# Patient Record
Sex: Female | Born: 1990 | Race: White | Hispanic: No | Marital: Single | Smoking: Current every day smoker
Health system: Southern US, Community
[De-identification: ages and names within clinical notes are randomized; demographics above are authoritative.]

---

## 2021-06-07 ENCOUNTER — Emergency Department (HOSPITAL_COMMUNITY): Payer: Self-pay

## 2021-06-07 ENCOUNTER — Encounter (HOSPITAL_COMMUNITY): Payer: Self-pay | Admitting: Emergency Medicine

## 2021-06-07 ENCOUNTER — Other Ambulatory Visit (HOSPITAL_COMMUNITY): Payer: Self-pay

## 2021-06-07 ENCOUNTER — Emergency Department (HOSPITAL_COMMUNITY)
Admission: EM | Admit: 2021-06-07 | Discharge: 2021-06-07 | Disposition: A | Discharge status: Home / Self Care | Payer: Self-pay | Attending: Emergency Medicine | Admitting: Emergency Medicine

## 2021-06-07 DIAGNOSIS — W19XXXA Unspecified fall, initial encounter: Secondary | ICD-10-CM

## 2021-06-07 DIAGNOSIS — F17210 Nicotine dependence, cigarettes, uncomplicated: Secondary | ICD-10-CM | POA: Insufficient documentation

## 2021-06-07 DIAGNOSIS — S0512XA Contusion of eyeball and orbital tissues, left eye, initial encounter: Secondary | ICD-10-CM | POA: Insufficient documentation

## 2021-06-07 DIAGNOSIS — W01198A Fall on same level from slipping, tripping and stumbling with subsequent striking against other object, initial encounter: Secondary | ICD-10-CM | POA: Insufficient documentation

## 2021-06-07 DIAGNOSIS — R519 Headache, unspecified: Secondary | ICD-10-CM | POA: Insufficient documentation

## 2021-06-07 DIAGNOSIS — S7012XA Contusion of left thigh, initial encounter: Secondary | ICD-10-CM | POA: Insufficient documentation

## 2021-06-07 LAB — PREGNANCY, URINE: Preg Test, Ur: NEGATIVE

## 2021-06-07 MED ORDER — ACETAMINOPHEN 500 MG PO TABS
1000.0000 mg | ORAL_TABLET | Freq: Once | ORAL | Status: AC
  Administered 2021-06-07: 1000 mg via ORAL
  Filled 2021-06-07: qty 2

## 2021-06-07 NOTE — ED Triage Notes (Signed)
Pt states she fell into a wall after doing tequila shots last night.  C/o bruising to L eye and L thigh.  Denies LOC.

## 2021-06-07 NOTE — ED Provider Notes (Signed)
[facility] EMERGENCY DEPARTMENT Provider Note   CSN: [id number] Arrival date & time: 06/07/21  1210     History No chief complaint on file.   Sheila Deleon is a 30 y.o. female.  HPI     30yo female presents with concern for facial and leg bruising and pain after falling last night.  She was intoxicated last night and stumbled in a hallway, hitting the left side of her face aon the wall and falling on her left side. No LOC.  Has had continuing headache since that time. Swelling around eye but otherwise no blurred vision. No epistaxis. No numbness/weakness. No neck pain or back pain. Left upper leg pain. Has been ambulatory. No n/v.  History reviewed. No pertinent past medical history.  There are no problems to display for this patient.   History reviewed. No pertinent surgical history.   OB History   No obstetric history on file.     No family history on file.  Social History   Tobacco Use   Smoking status: Every Day    Pack years: 0.00    Types: Cigarettes   Smokeless tobacco: Never  Substance Use Topics   Alcohol use: Yes   Drug use: Not Currently    Home Medications Prior to Admission medications   Medication Sig Start Date End Date Taking? Authorizing Provider  B Complex Vitamins (B COMPLEX PO) Take 1 tablet by mouth daily.   Yes [provider]  MIRENA, 52 MG, 20 MCG/DAY IUD 1 each by Intrauterine route once.   Yes [provider]  Multiple Vitamins-Minerals (ONE-A-DAY WOMENS PO) Take 1 tablet by mouth daily with breakfast.   Yes [provider]  NON FORMULARY Take 1-2 capsules by mouth See admin instructions. Immunity One capsules with Echinacea and Ginger- Take 1-2 capsules by mouth daily   Yes [provider]  TURMERIC PO Take 1-2 capsules by mouth daily.   Yes [provider]    Allergies    Patient has no known allergies.  Review of Systems   Review of Systems  Constitutional:  Negative  for fever.  HENT:  Negative for nosebleeds.   Eyes:  Negative for photophobia, pain, redness and visual disturbance.  Respiratory:  Negative for shortness of breath.   Cardiovascular:  Negative for chest pain.  Gastrointestinal:  Negative for nausea and vomiting.  Musculoskeletal:  Positive for arthralgias. Negative for back pain and neck pain.  Skin:  Positive for color change. Negative for wound.  Neurological:  Positive for headaches.   Physical Exam Updated Vital Signs BP 112/70   Pulse 68   Temp 98.4 F (36.9 C) (Oral)   Resp 18   SpO2 98%   Physical Exam Vitals and nursing note reviewed.  Constitutional:      General: She is not in acute distress.    Appearance: She is well-developed. She is not diaphoretic.  HENT:     Head: Normocephalic.     Comments: Periorbital contusion Normal EOM, no hyphema, no globe abnormality    Right Ear: Tympanic membrane and external ear normal.     Left Ear: Tympanic membrane and external ear normal.  Eyes:     Extraocular Movements: Extraocular movements intact.     Conjunctiva/sclera: Conjunctivae normal.     Pupils: Pupils are equal, round, and reactive to light.  Cardiovascular:     Rate and Rhythm: Normal rate and regular rhythm.  Pulmonary:     Effort: Pulmonary effort is normal.  No respiratory distress.  Musculoskeletal:        General: Tenderness (contusion left thigh with tenderness lateral proximal leg) present.     Cervical back: Normal range of motion.  Skin:    General: Skin is warm and dry.     Findings: No erythema or rash.  Neurological:     Mental Status: She is alert and oriented to person, place, and time.    ED Results / Procedures / Treatments   Labs (all labs ordered are listed, but only abnormal results are displayed) Labs Reviewed  PREGNANCY, URINE    EKG None  Radiology CT Head Wo Contrast  Result Date: 06/07/2021 CLINICAL DATA:  Fall with head and facial injury last night. EXAM: CT HEAD WITHOUT  CONTRAST CT MAXILLOFACIAL WITHOUT CONTRAST TECHNIQUE: Multidetector CT imaging of the head and maxillofacial structures were performed using the standard protocol without intravenous contrast. Multiplanar CT image reconstructions of the maxillofacial structures were also generated. COMPARISON:  None. FINDINGS: CT HEAD FINDINGS Brain: No evidence of acute infarction, hemorrhage, hydrocephalus, extra-axial collection or mass lesion/mass effect. Vascular: No hyperdense vessel or unexpected calcification. Skull: Normal. Negative for fracture or focal lesion. Other: Minimal left infraorbital soft tissue swelling. CT MAXILLOFACIAL FINDINGS Osseous: No evidence of facial bone fracture. Dental carie involving the left lower first molar tooth. Orbits: Orbits are normal symmetric. Sinuses: Paranasal sinuses are clear.  No air-fluid levels. Soft tissues: Mild soft tissue swelling over the left infraorbital region and anterolateral mid face. IMPRESSION: 1. Normal head CT. 2. No acute facial bone fracture. Mild soft tissue swelling over the left infraorbital region and anterolateral mid face. Electronically Signed   By: Elberta Fortis M.D.   On: 06/07/2021 14:00   DG Femur Min 2 Views Left  Result Date: 06/07/2021 CLINICAL DATA:  Fall last night with left leg injury. EXAM: LEFT FEMUR 2 VIEWS COMPARISON:  None. FINDINGS: There is no evidence of fracture or other focal bone lesions. Soft tissues are unremarkable. IMPRESSION: Negative. Electronically Signed   By: Elberta Fortis M.D.   On: 06/07/2021 13:28   CT Maxillofacial Wo Contrast  Result Date: 06/07/2021 CLINICAL DATA:  Fall with head and facial injury last night. EXAM: CT HEAD WITHOUT CONTRAST CT MAXILLOFACIAL WITHOUT CONTRAST TECHNIQUE: Multidetector CT imaging of the head and maxillofacial structures were performed using the standard protocol without intravenous contrast. Multiplanar CT image reconstructions of the maxillofacial structures were also generated.  COMPARISON:  None. FINDINGS: CT HEAD FINDINGS Brain: No evidence of acute infarction, hemorrhage, hydrocephalus, extra-axial collection or mass lesion/mass effect. Vascular: No hyperdense vessel or unexpected calcification. Skull: Normal. Negative for fracture or focal lesion. Other: Minimal left infraorbital soft tissue swelling. CT MAXILLOFACIAL FINDINGS Osseous: No evidence of facial bone fracture. Dental carie involving the left lower first molar tooth. Orbits: Orbits are normal symmetric. Sinuses: Paranasal sinuses are clear.  No air-fluid levels. Soft tissues: Mild soft tissue swelling over the left infraorbital region and anterolateral mid face. IMPRESSION: 1. Normal head CT. 2. No acute facial bone fracture. Mild soft tissue swelling over the left infraorbital region and anterolateral mid face. Electronically Signed   By: Elberta Fortis M.D.   On: 06/07/2021 14:00    Procedures Procedures   Medications Ordered in ED Medications  acetaminophen (TYLENOL) tablet 1,000 mg (1,000 mg Oral Given 06/07/21 1352)    ED Course  I have reviewed the triage vital signs and the nursing notes.  Pertinent labs & imaging results that were available during my care of  the patient were reviewed by me and considered in my medical decision making (see chart for details).    MDM Rules/Calculators/A&P                           30yo female presents with concern for facial and leg bruising and pain after falling last night. CT head and maxillofacial without fracture.No sign of ocular injury by history or exam.  XR femur without fracture.  Suspect contusions, recommend continued supportive care.   Final Clinical Impression(s) / ED Diagnoses Final diagnoses:  Periorbital contusion of left eye, initial encounter  Fall, initial encounter  Contusion of left thigh, initial encounter    Rx / DC Orders ED Discharge Orders     None        Alvira Monday, MD 06/08/21 1520

## 2022-09-09 IMAGING — DX DG FEMUR 2+V*L*
4 series · 4 of 4 positions shown · non-contrast
Comparison: None.

CLINICAL DATA: Fall last night with left leg injury.

EXAM:
LEFT FEMUR 2 VIEWS

[femur ap (1 of 2)]
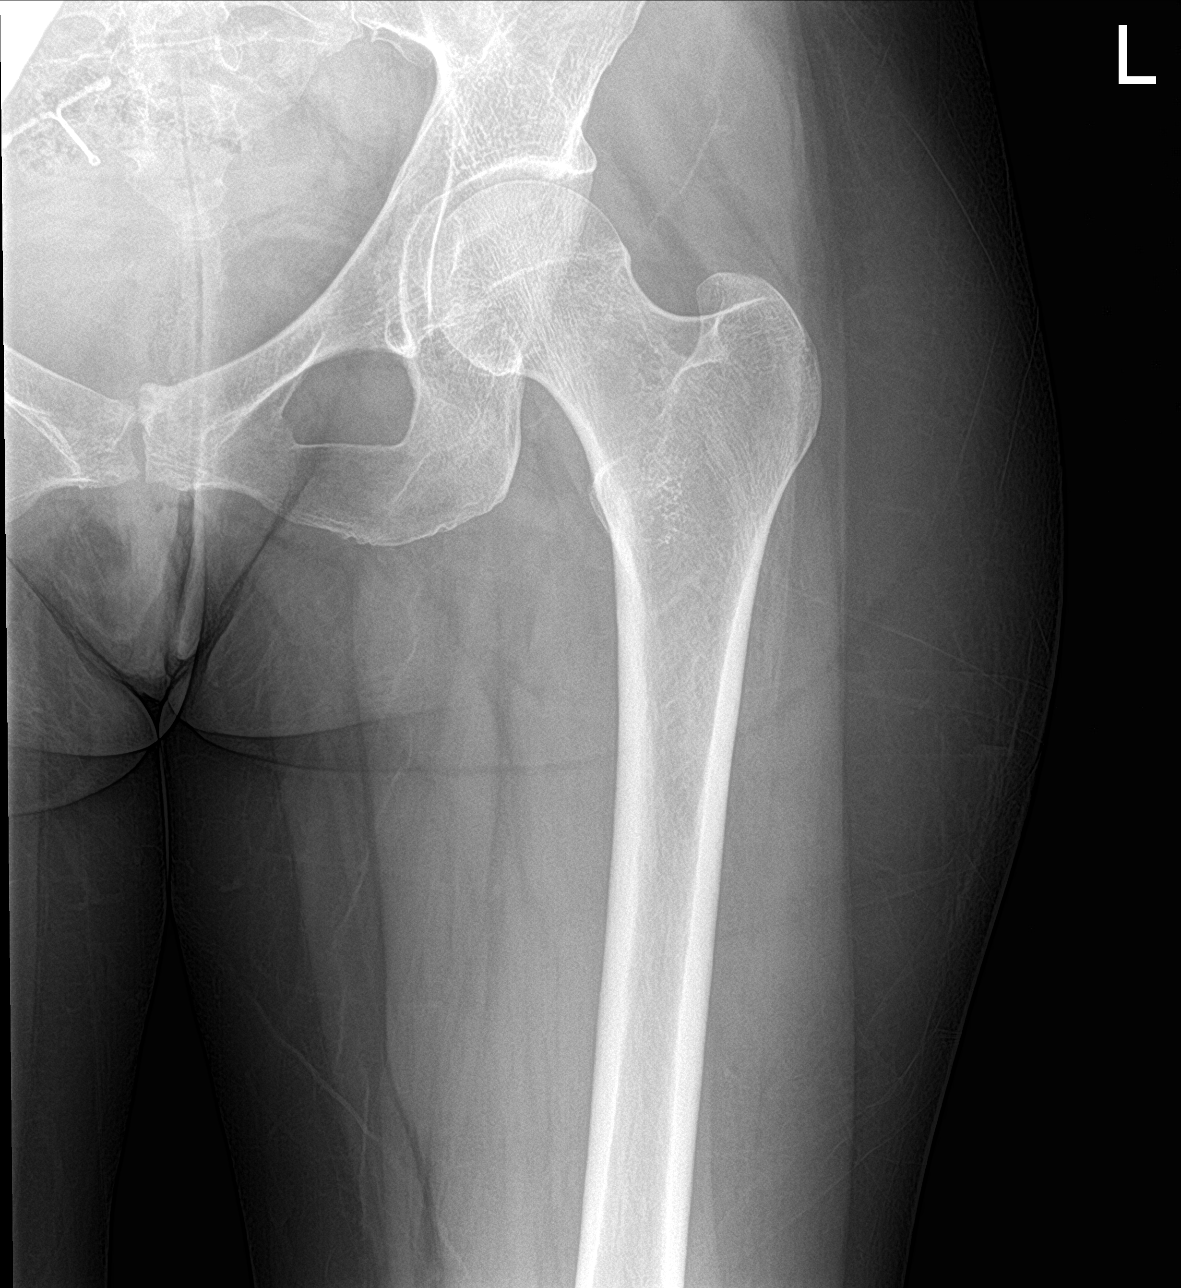

[femur ap (2 of 2)]
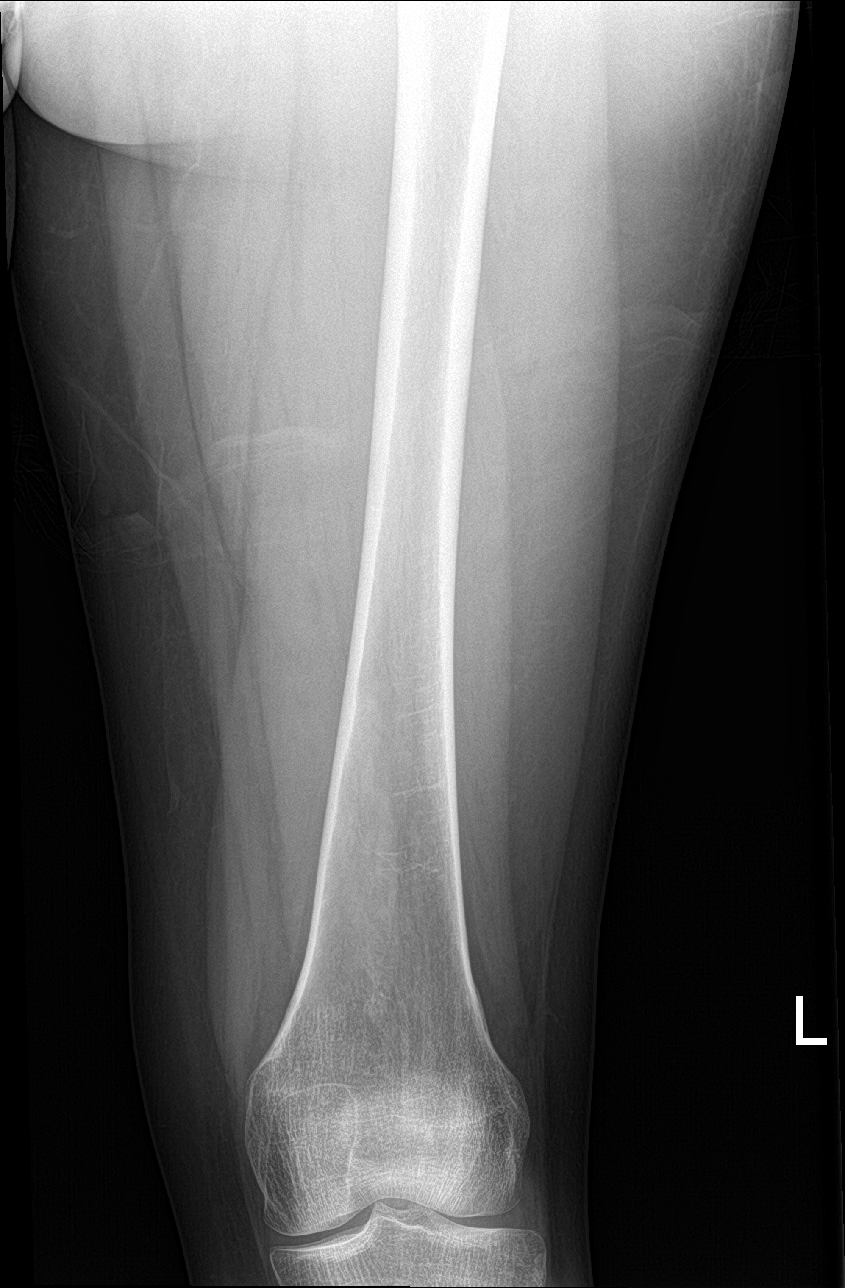

[femur lat (1 of 2)]
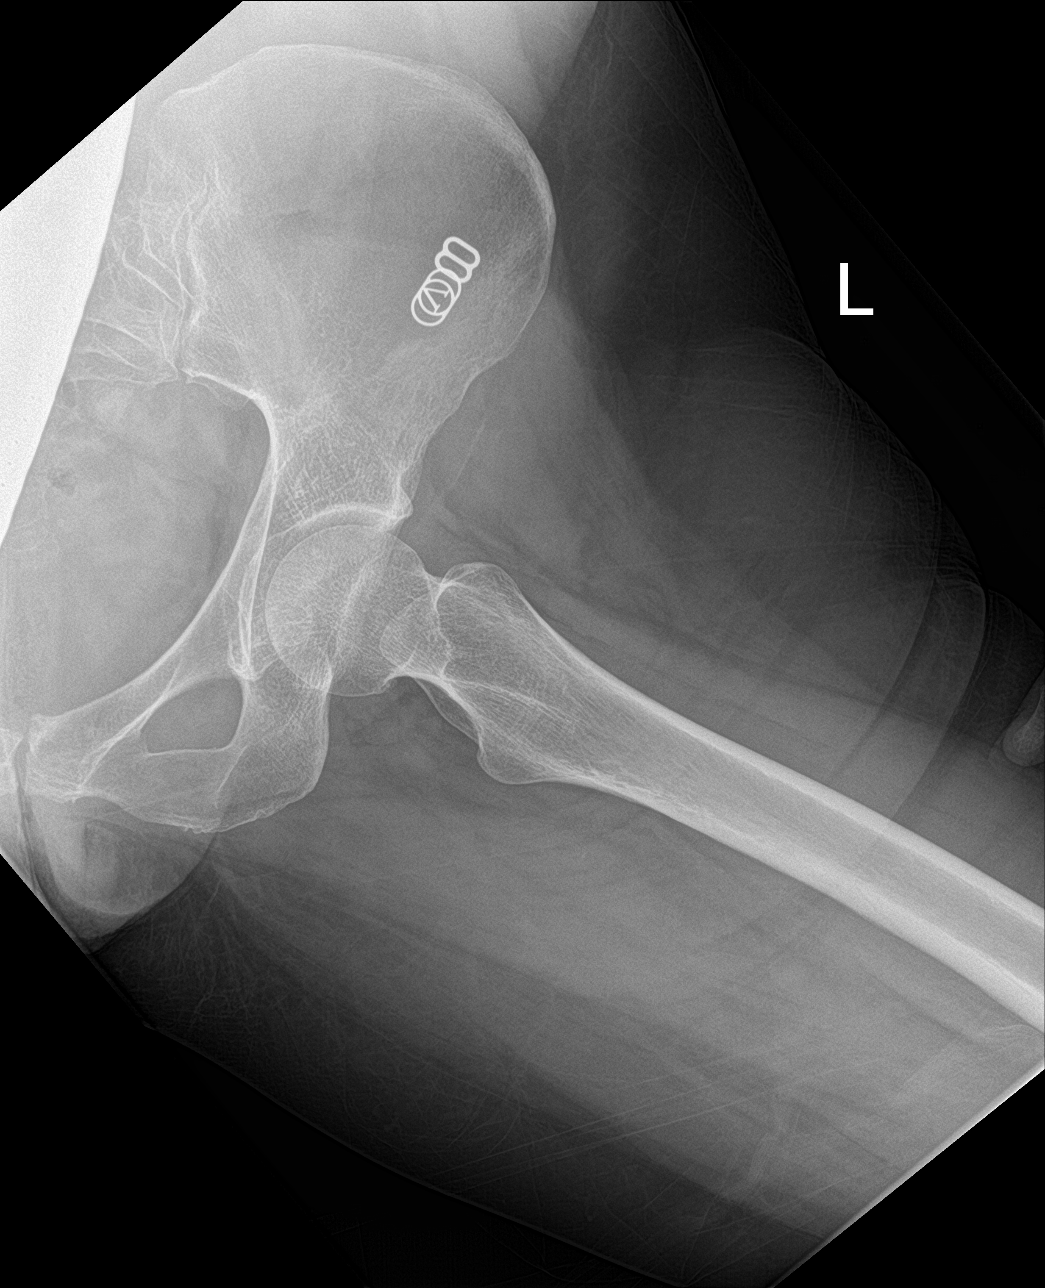

[femur lat (2 of 2)]
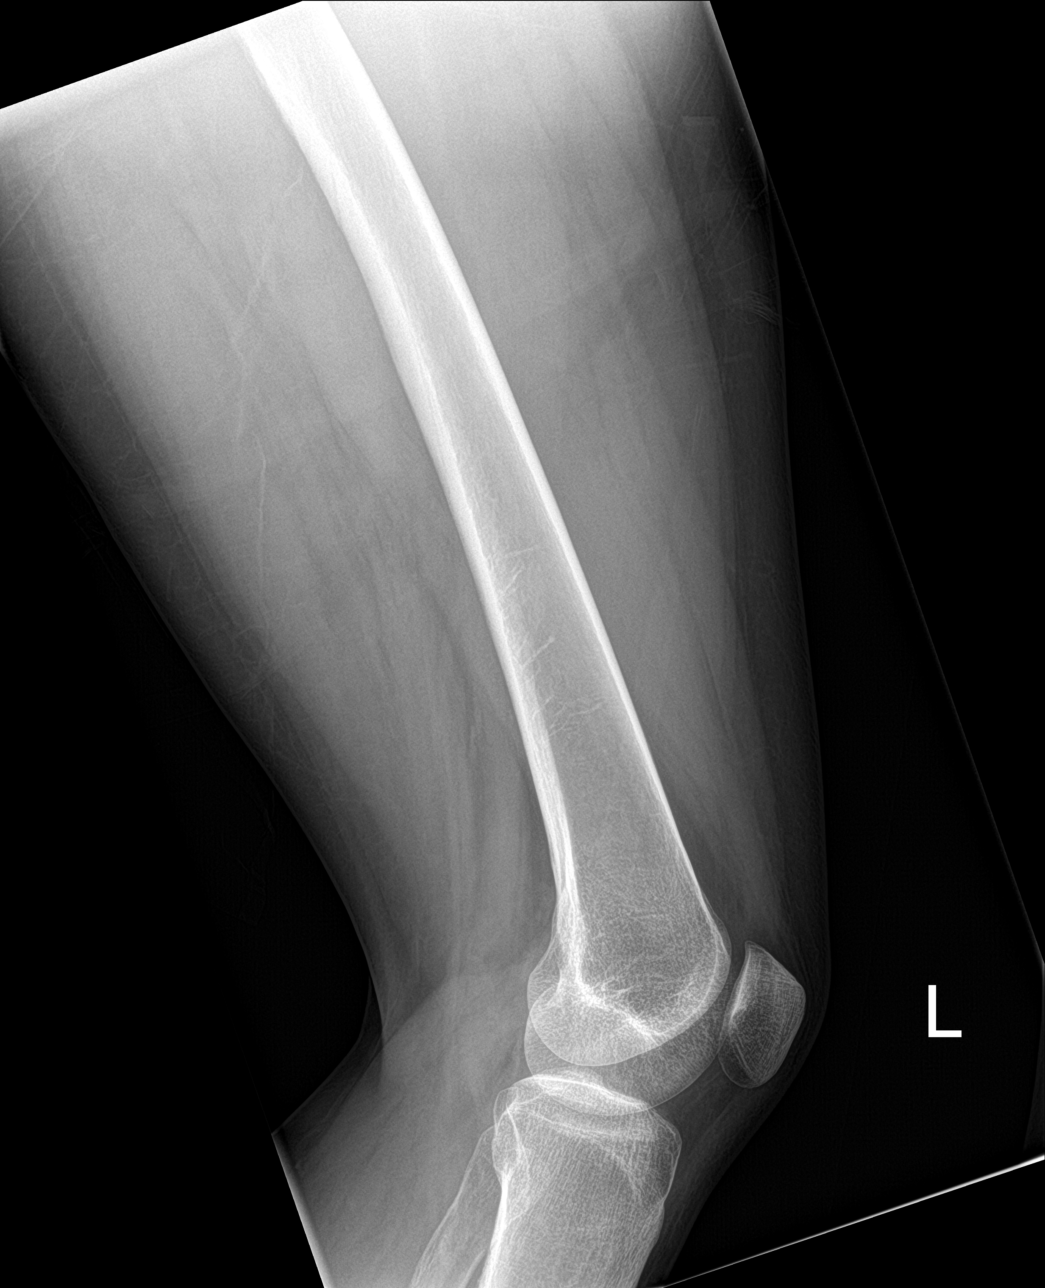

[4 of 4 positions shown; findings below may reference images not displayed]

FINDINGS: There is no evidence of fracture or other focal bone lesions. Soft
tissues are unremarkable.
IMPRESSION: Negative.
